# Patient Record
Sex: Male | Born: 1995 | Hispanic: Yes | Marital: Single
Health system: Southern US, Community
[De-identification: ages and names within clinical notes are randomized; demographics above are authoritative.]

---

## 2016-12-14 ENCOUNTER — Emergency Department (HOSPITAL_COMMUNITY): Payer: No Typology Code available for payment source

## 2016-12-14 ENCOUNTER — Encounter (HOSPITAL_COMMUNITY): Payer: Self-pay | Admitting: Emergency Medicine

## 2016-12-14 ENCOUNTER — Emergency Department (HOSPITAL_COMMUNITY)
Admission: EM | Admit: 2016-12-14 | Discharge: 2016-12-14 | Disposition: A | Discharge status: Home / Self Care | Payer: No Typology Code available for payment source | Attending: Emergency Medicine | Admitting: Emergency Medicine

## 2016-12-14 DIAGNOSIS — Y939 Activity, unspecified: Secondary | ICD-10-CM | POA: Insufficient documentation

## 2016-12-14 DIAGNOSIS — Y999 Unspecified external cause status: Secondary | ICD-10-CM | POA: Diagnosis not present

## 2016-12-14 DIAGNOSIS — F10120 Alcohol abuse with intoxication, uncomplicated: Secondary | ICD-10-CM | POA: Insufficient documentation

## 2016-12-14 DIAGNOSIS — R791 Abnormal coagulation profile: Secondary | ICD-10-CM | POA: Diagnosis not present

## 2016-12-14 DIAGNOSIS — R935 Abnormal findings on diagnostic imaging of other abdominal regions, including retroperitoneum: Secondary | ICD-10-CM | POA: Diagnosis not present

## 2016-12-14 DIAGNOSIS — F10920 Alcohol use, unspecified with intoxication, uncomplicated: Secondary | ICD-10-CM

## 2016-12-14 DIAGNOSIS — R931 Abnormal findings on diagnostic imaging of heart and coronary circulation: Secondary | ICD-10-CM | POA: Insufficient documentation

## 2016-12-14 DIAGNOSIS — Y92410 Unspecified street and highway as the place of occurrence of the external cause: Secondary | ICD-10-CM | POA: Insufficient documentation

## 2016-12-14 DIAGNOSIS — R930 Abnormal findings on diagnostic imaging of skull and head, not elsewhere classified: Secondary | ICD-10-CM | POA: Diagnosis not present

## 2016-12-14 DIAGNOSIS — E860 Dehydration: Secondary | ICD-10-CM | POA: Diagnosis not present

## 2016-12-14 LAB — PREPARE FRESH FROZEN PLASMA
BLOOD PRODUCT EXPIRATION DATE: 201801032359
BLOOD PRODUCT EXPIRATION DATE: 201801142359
ISSUE DATE / TIME: 201801020239
ISSUE DATE / TIME: 201801020239
UNIT TYPE AND RH: 6200
Unit Type and Rh: 6200

## 2016-12-14 LAB — URINALYSIS, ROUTINE W REFLEX MICROSCOPIC
Bilirubin Urine: NEGATIVE
Glucose, UA: NEGATIVE mg/dL
Hgb urine dipstick: NEGATIVE
KETONES UR: NEGATIVE mg/dL
LEUKOCYTES UA: NEGATIVE
NITRITE: NEGATIVE
PH: 5 (ref 5.0–8.0)
PROTEIN: NEGATIVE mg/dL
Specific Gravity, Urine: 1.028 (ref 1.005–1.030)

## 2016-12-14 LAB — CBC
HCT: 49.3 % (ref 39.0–52.0)
Hemoglobin: 17.9 g/dL — ABNORMAL HIGH (ref 13.0–17.0)
MCH: 31 pg (ref 26.0–34.0)
MCHC: 36.3 g/dL — ABNORMAL HIGH (ref 30.0–36.0)
MCV: 85.4 fL (ref 78.0–100.0)
PLATELETS: 248 10*3/uL (ref 150–400)
RBC: 5.77 MIL/uL (ref 4.22–5.81)
RDW: 12.3 % (ref 11.5–15.5)
WBC: 13.5 10*3/uL — ABNORMAL HIGH (ref 4.0–10.5)

## 2016-12-14 LAB — COMPREHENSIVE METABOLIC PANEL
ALT: 176 U/L — AB (ref 17–63)
AST: 111 U/L — ABNORMAL HIGH (ref 15–41)
Albumin: 4.5 g/dL (ref 3.5–5.0)
Alkaline Phosphatase: 79 U/L (ref 38–126)
Anion gap: 12 (ref 5–15)
BUN: 8 mg/dL (ref 6–20)
CHLORIDE: 101 mmol/L (ref 101–111)
CO2: 25 mmol/L (ref 22–32)
CREATININE: 0.95 mg/dL (ref 0.61–1.24)
Calcium: 9 mg/dL (ref 8.9–10.3)
Glucose, Bld: 108 mg/dL — ABNORMAL HIGH (ref 65–99)
Potassium: 3.8 mmol/L (ref 3.5–5.1)
Sodium: 138 mmol/L (ref 135–145)
Total Bilirubin: 0.3 mg/dL (ref 0.3–1.2)
Total Protein: 8.8 g/dL — ABNORMAL HIGH (ref 6.5–8.1)

## 2016-12-14 LAB — PROTIME-INR
INR: 0.89
PROTHROMBIN TIME: 12.1 s (ref 11.4–15.2)

## 2016-12-14 LAB — I-STAT CHEM 8, ED
BUN: 11 mg/dL (ref 6–20)
CALCIUM ION: 1.05 mmol/L — AB (ref 1.15–1.40)
CREATININE: 1.3 mg/dL — AB (ref 0.61–1.24)
Chloride: 104 mmol/L (ref 101–111)
GLUCOSE: 111 mg/dL — AB (ref 65–99)
HCT: 53 % — ABNORMAL HIGH (ref 39.0–52.0)
HEMOGLOBIN: 18 g/dL — AB (ref 13.0–17.0)
Potassium: 3.9 mmol/L (ref 3.5–5.1)
Sodium: 141 mmol/L (ref 135–145)
TCO2: 28 mmol/L (ref 0–100)

## 2016-12-14 LAB — TYPE AND SCREEN
Blood Product Expiration Date: 201801082359
Blood Product Expiration Date: 201801122359
ISSUE DATE / TIME: 201801020238
ISSUE DATE / TIME: 201801020238
Unit Type and Rh: 9500
Unit Type and Rh: 9500

## 2016-12-14 LAB — CDS SEROLOGY

## 2016-12-14 LAB — ETHANOL: Alcohol, Ethyl (B): 245 mg/dL — ABNORMAL HIGH (ref <5)

## 2016-12-14 LAB — ABO/RH: ABO/RH(D): O POS

## 2016-12-14 LAB — I-STAT CG4 LACTIC ACID, ED: LACTIC ACID, VENOUS: 1.71 mmol/L (ref 0.5–1.9)

## 2016-12-14 LAB — BLOOD PRODUCT ORDER (VERBAL) VERIFICATION

## 2016-12-14 MED ORDER — NAPROXEN 375 MG PO TABS: 375.0000 mg | ORAL_TABLET | Freq: Two times a day (BID) | ORAL | 0 refills | Status: AC | PRN

## 2016-12-14 MED ORDER — IOPAMIDOL (ISOVUE-300) INJECTION 61%
INTRAVENOUS | Status: AC
  Administered 2016-12-14: 100 mL via INTRAVENOUS
  Filled 2016-12-14: qty 100

## 2016-12-14 MED ORDER — ONDANSETRON HCL 4 MG/2ML IJ SOLN
INTRAMUSCULAR | Status: AC
  Filled 2016-12-14: qty 2

## 2016-12-14 MED ORDER — SODIUM CHLORIDE 0.9 % IV SOLN
Freq: Once | INTRAVENOUS | Status: AC
  Administered 2016-12-14: 04:00:00 via INTRAVENOUS

## 2016-12-14 MED ORDER — LORAZEPAM 2 MG/ML IJ SOLN
1.0000 mg | Freq: Once | INTRAMUSCULAR | Status: AC
  Administered 2016-12-14: 1 mg via INTRAVENOUS
  Filled 2016-12-14: qty 1

## 2016-12-14 MED ORDER — ONDANSETRON HCL 4 MG/2ML IJ SOLN
4.0000 mg | Freq: Once | INTRAMUSCULAR | Status: AC
  Administered 2016-12-14: 4 mg via INTRAVENOUS

## 2016-12-14 MED ORDER — SODIUM CHLORIDE 0.9 % IV BOLUS (SEPSIS)
1000.0000 mL | Freq: Once | INTRAVENOUS | Status: AC
  Administered 2016-12-14: 1000 mL via INTRAVENOUS
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
## 2016-12-14 NOTE — ED Notes (Signed)
Pt transported to CT ?
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
## 2016-12-14 NOTE — ED Notes (Signed)
Pt ambulated in hallway without assistance, gait is smooth and steady. Patient given telephone to contact family.

## 2016-12-14 NOTE — ED Notes (Signed)
Pt unable to obtain ride, social work contacted to obtain alternative option due to [place] living in [place].
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
## 2016-12-14 NOTE — ED Provider Notes (Addendum)
I assumed care from Dr. Pattricia BossAnnie at 7 AM. Patient is now alert, awake, and angled toward. He appears clinically sober. I discussed the labs and imaging with the patient. Patient does appear mildly dehydrated on lab work and clinically with mild tachycardia. He has been given IV fluids with improvement of this when examiner not in room. Otherwise, there are no apparent acute abnormalitiess on full CT imaging. He is tolerating by mouth without difficulty. His abdomen is soft, NT, and ND on my exam. Will discharge with return precautions.   Shaune Pollackameron Tyffani Foglesong, MD 12/14/16 [id number]    Shaune Pollackameron Caitlyn Buchanan, MD 12/14/16 1046

## 2016-12-14 NOTE — ED Notes (Signed)
Airway is patent  

## 2016-12-14 NOTE — ED Notes (Signed)
Pt discharged to home via Cab with assistance from Social work. Patient alert and oriented, and declines discharge vitals, Meal given to patient. Rx x1
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
## 2016-12-14 NOTE — ED Notes (Signed)
Pt. Given water to drink.

## 2016-12-14 NOTE — Progress Notes (Signed)
   12/14/16 0300  Clinical Encounter Type  Visited With Patient not available  Visit Type ED;Trauma  Referral From Nurse  [facility] listed as having unequal pupils. Upon arrival, that did not appear to be the case. Patient taken to CT, no family present. Chaplain advised available if needed. Arliss Hepburn, Chaplain
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
